# Patient Record
Sex: Male | Born: 1975 | Race: Asian | Hispanic: No | Marital: Single | Smoking: Never smoker
Health system: Southern US, Community
[De-identification: ages and names within clinical notes are randomized; demographics above are authoritative.]

## PROBLEM LIST (undated history)

## (undated) DIAGNOSIS — J323 Chronic sphenoidal sinusitis: Secondary | ICD-10-CM

## (undated) HISTORY — PX: ANKLE SURGERY: SHX546

---

## 2013-08-30 ENCOUNTER — Emergency Department: Payer: Self-pay | Admitting: Emergency Medicine

## 2013-12-04 ENCOUNTER — Emergency Department: Payer: Self-pay | Admitting: Emergency Medicine

## 2014-03-09 ENCOUNTER — Emergency Department: Payer: Self-pay | Admitting: Internal Medicine

## 2014-10-21 IMAGING — CR DG CHEST 2V
1 series · 2 of 2 positions shown · non-contrast
Comparison: None.

CLINICAL DATA: Cough

EXAM:
CHEST  2 VIEW

[Series 1: w chest pa · 0.14mm/px · 2 of 2 slices shown]
[im 1/2]
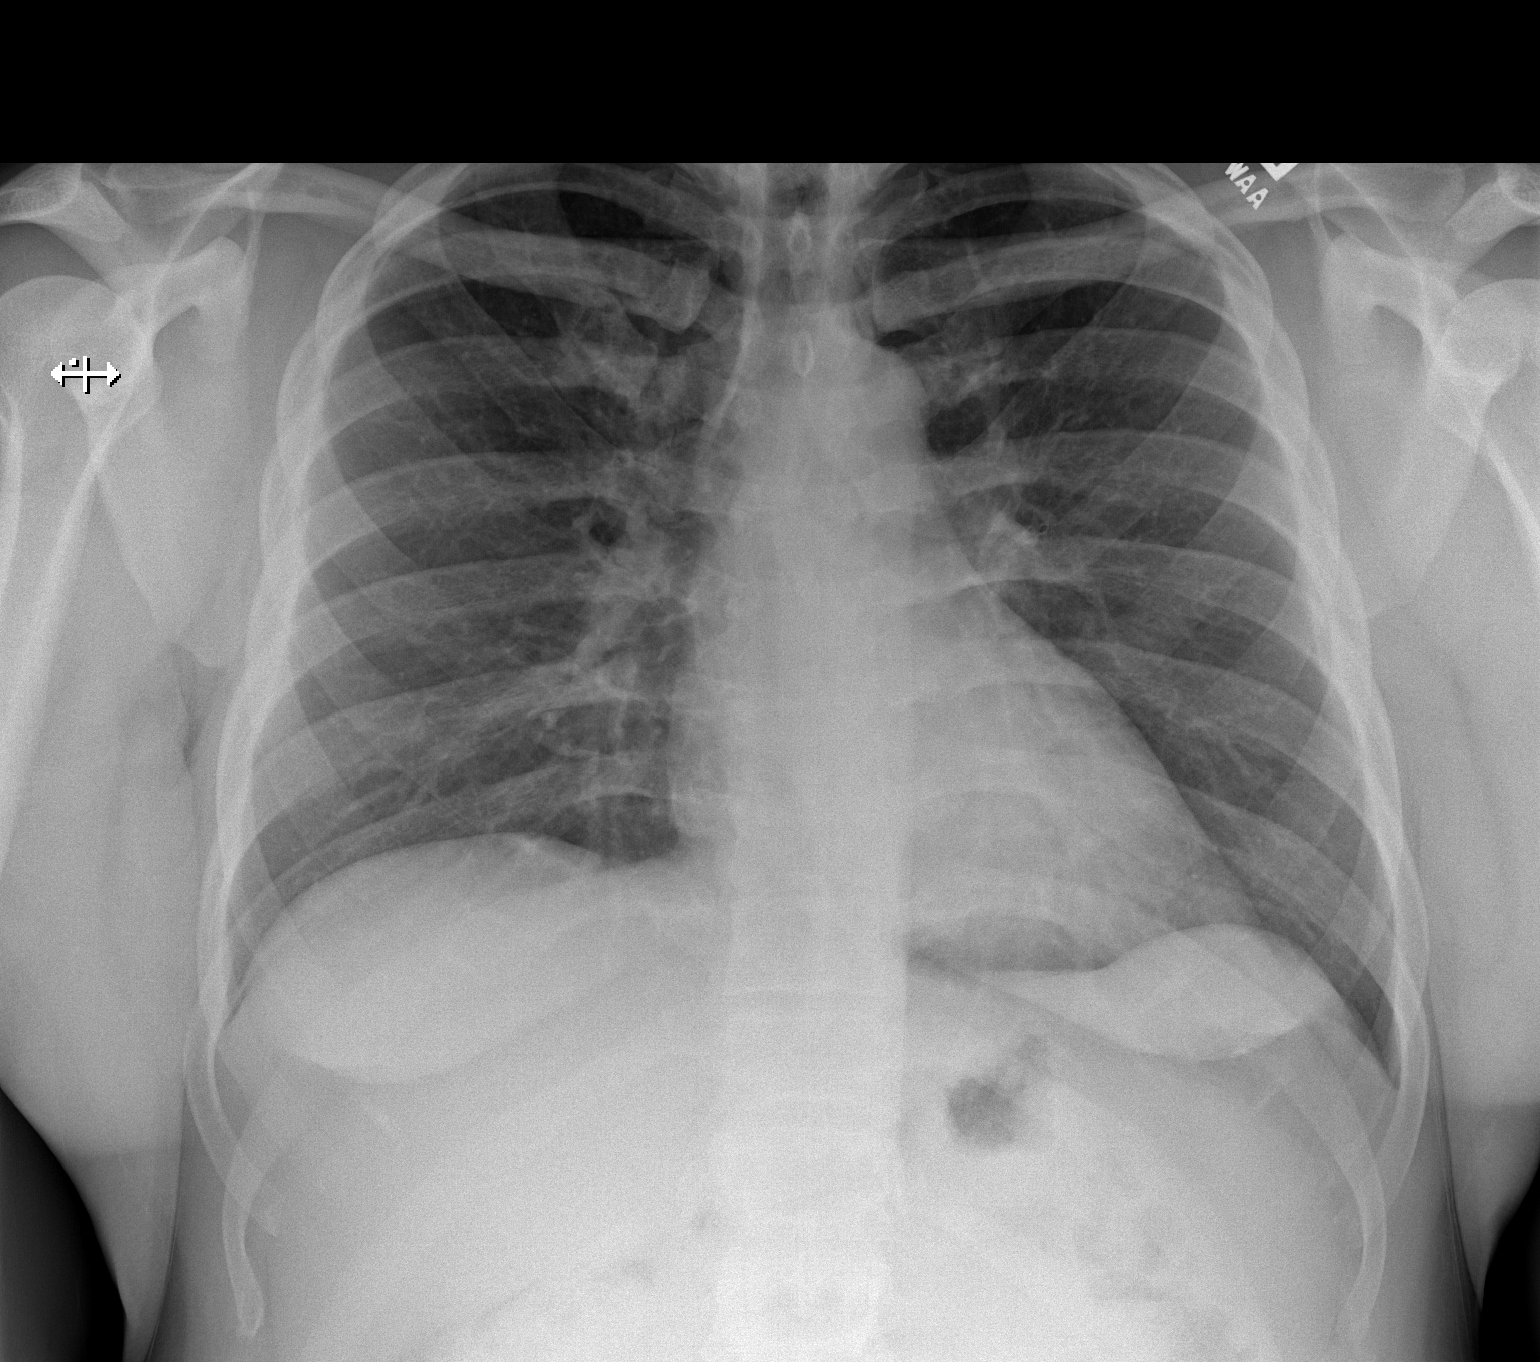
[im 2/2]
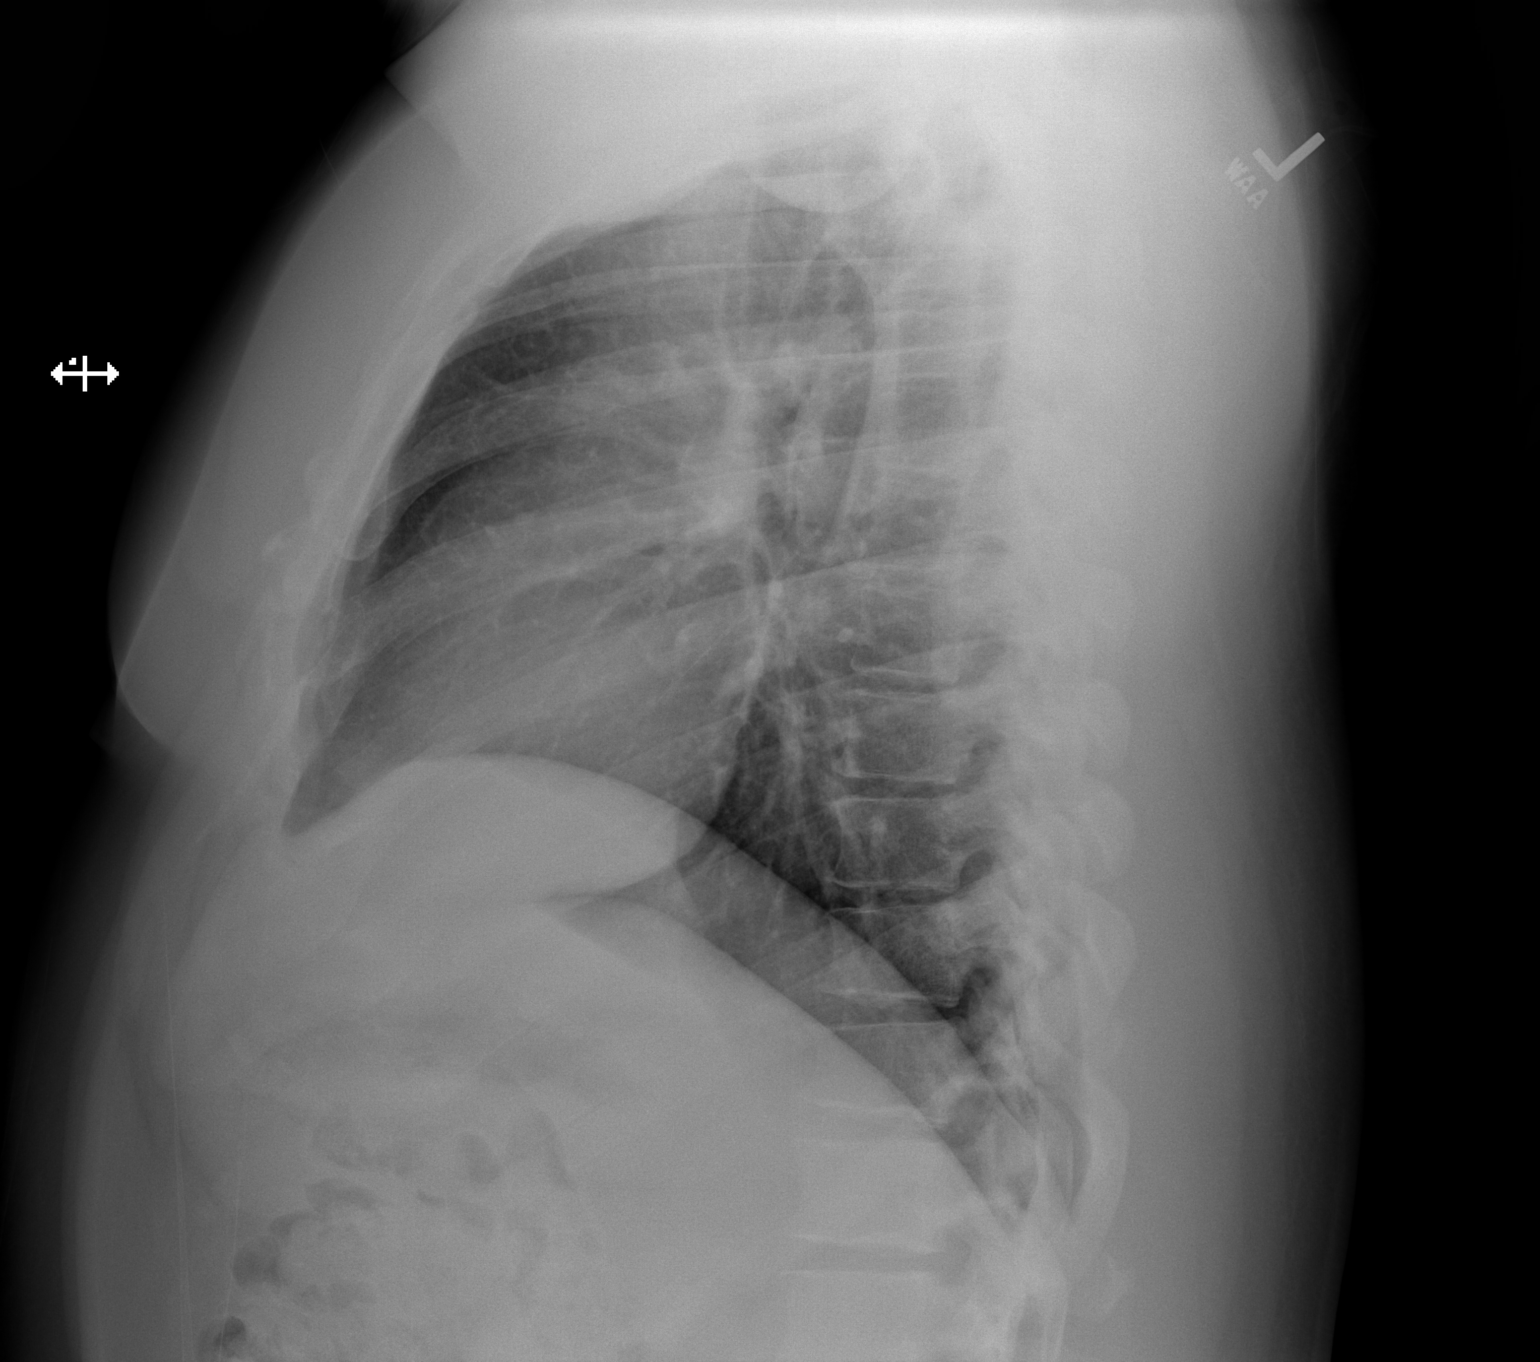

[2 of 2 positions shown; findings below may reference images not displayed]

FINDINGS: Cardiomediastinal silhouette is unremarkable. No acute infiltrate or
pleural effusion. No pulmonary edema. Bony thorax is unremarkable.
IMPRESSION: No active cardiopulmonary disease.

## 2017-07-12 ENCOUNTER — Ambulatory Visit
Admission: EM | Admit: 2017-07-12 | Discharge: 2017-07-12 | Disposition: A | Discharge status: Home / Self Care | Payer: 59 | Attending: Family Medicine | Admitting: Family Medicine

## 2017-07-12 ENCOUNTER — Encounter: Payer: Self-pay | Admitting: *Deleted

## 2017-07-12 DIAGNOSIS — S0591XA Unspecified injury of right eye and orbit, initial encounter: Secondary | ICD-10-CM | POA: Diagnosis not present

## 2017-07-12 DIAGNOSIS — H1131 Conjunctival hemorrhage, right eye: Secondary | ICD-10-CM

## 2017-07-12 HISTORY — DX: Chronic sphenoidal sinusitis: J32.3

## 2017-07-12 MED ORDER — MOXIFLOXACIN HCL 0.5 % OP SOLN: 1.0000 [drp] | Freq: Three times a day (TID) | OPHTHALMIC | 0 refills | Status: AC

## 2017-07-12 NOTE — ED Provider Notes (Signed)
CSN: [id number]     Arrival date & time 07/12/17  1930 History   First MD Initiated Contact with Patient 07/12/17 1956     Chief Complaint  Patient presents with  . Eye Injury   (Consider location/radiation/quality/duration/timing/severity/associated sxs/prior Treatment) HPI  This a 41 year old at work today he injured his right eye when the edge of a cardboard box top flap scratched his eye. Following that incident he noticed some bloodshot area lateral to his iris. He does not feel as though there is anything I in his eye does not have any photophobia. He does wear contacts but not routinely was not wearing them today. His visual acuity today on both eyes was 20/200 without a contacts.        Past Medical History:  Diagnosis Date  . Sphenoidal sinusitis    Past Surgical History:  Procedure Laterality Date  . ANKLE SURGERY     Family History  Problem Relation Age of Onset  . Cancer Mother   . Hypertension Mother   . Hypertension Father    Social History  Substance Use Topics  . Smoking status: Never Smoker  . Smokeless tobacco: Never Used  . Alcohol use Yes    Review of Systems  Constitutional: Positive for activity change. Negative for appetite change, chills, fatigue and fever.  Eyes: Positive for redness. Negative for photophobia, pain, discharge, itching and visual disturbance.    Allergies  Patient has no known allergies.  Home Medications   Prior to Admission medications   Medication Sig Start Date End Date Taking? Authorizing Provider  moxifloxacin (VIGAMOX) 0.5 % ophthalmic solution Place 1 drop into the right eye 3 (three) times daily. 07/12/17   Lutricia Feil, PA-C   Meds Ordered and Administered this Visit  Medications - No data to display  BP (!) 129/95 (BP Location: Left Arm)   Pulse 62   Temp 98.5 F (36.9 C) (Oral)   Resp 16   Ht 5\' 9"  (1.753 m)   Wt 231 lb (104.8 kg)   SpO2 100%   BMI 34.11 kg/m  No data found.   Physical Exam   Constitutional: He is oriented to person, place, and time. He appears well-developed and well-nourished. No distress.  HENT:  Head: Normocephalic.  Eyes: Pupils are equal, round, and reactive to light. EOM are normal. Right eye exhibits no discharge. Left eye exhibits no discharge.  Examination of the right eye shows a subconjunctival hemorrhage laterally. It barely reaches the iris. EOMs are intact. PERRLA. There is no free foreign objects under the upper lid which was everted. The eye was anesthetized with tetracaine. With adequate anesthesia florescence stain was then utilized with a Wood's lamp to examine the eye. The cornea was not abraded. He did have fluoresceine uptake in the conjunctiva lateral to the iris. No hyphema was seen. Visual acuity was 20/200 on both eyes.  Neck: Normal range of motion.  Musculoskeletal: Normal range of motion.  Neurological: He is alert and oriented to person, place, and time.  Skin: Skin is warm and dry. He is not diaphoretic.  Psychiatric: He has a normal mood and affect. His behavior is normal. Judgment and thought content normal.  Nursing note and vitals reviewed.   Urgent Care Course     Procedures (including critical care time)  Labs Review Labs Reviewed - No data to display  Imaging Review No results found.   Visual Acuity Review  Right Eye Distance: 20/200 Left Eye Distance: 20/200 Bilateral Distance: 20/200  Right Eye Near:   Left Eye Near:    Bilateral Near:         MDM   1. Subconjunctival bleed, right    Discharge Medication List as of 07/12/2017  8:13 PM    START taking these medications   Details  moxifloxacin (VIGAMOX) 0.5 % ophthalmic solution Place 1 drop into the right eye 3 (three) times daily., Starting Tue 07/12/2017, Normal      Plan: 1. Test/x-ray results and diagnosis reviewed with patient 2. rx as per orders; risks, benefits, potential side effects reviewed with patient 3. Recommend supportive treatment  with use of eyedrops as prescribed. Recommended the patient be seen by ophthalmology tomorrow. He will need vision correction and follow-up of the scratch of the conjunctiva..  4. F/u prn if symptoms worsen or don't improve     Lutricia FeilRoemer, Lunette Tapp P, PA-C 07/12/17 2029

## 2017-07-12 NOTE — ED Triage Notes (Signed)
Patient injured his right eye today when it was hit with the edge of a cardboard box top flap.

## 2017-07-12 NOTE — ED Notes (Signed)
Patient normally wears contact lenses, but have removed them due to right eye injury.

## 2018-04-20 ENCOUNTER — Ambulatory Visit
Admission: EM | Admit: 2018-04-20 | Discharge: 2018-04-20 | Disposition: A | Discharge status: Home / Self Care | Payer: 59 | Attending: Family Medicine | Admitting: Family Medicine

## 2018-04-20 ENCOUNTER — Other Ambulatory Visit: Payer: Self-pay

## 2018-04-20 DIAGNOSIS — Z23 Encounter for immunization: Secondary | ICD-10-CM

## 2018-04-20 DIAGNOSIS — A630 Anogenital (venereal) warts: Secondary | ICD-10-CM | POA: Diagnosis not present

## 2018-04-20 DIAGNOSIS — S61213A Laceration without foreign body of left middle finger without damage to nail, initial encounter: Secondary | ICD-10-CM | POA: Diagnosis not present

## 2018-04-20 MED ORDER — MUPIROCIN 2 % EX OINT: 1.0000 "application " | TOPICAL_OINTMENT | Freq: Two times a day (BID) | CUTANEOUS | 0 refills | Status: AC

## 2018-04-20 MED ORDER — IMIQUIMOD 5 % EX CREA: TOPICAL_CREAM | CUTANEOUS | 1 refills | Status: AC

## 2018-04-20 MED ORDER — TETANUS-DIPHTH-ACELL PERTUSSIS 5-2.5-18.5 LF-MCG/0.5 IM SUSP
0.5000 mL | Freq: Once | INTRAMUSCULAR | Status: AC
  Administered 2018-04-20: 0.5 mL via INTRAMUSCULAR

## 2018-04-20 NOTE — Discharge Instructions (Signed)
Medication as prescribed.  Take care  Dr. Hedaya Latendresse  

## 2018-04-20 NOTE — ED Provider Notes (Signed)
[facility]    CSN: [id number] Arrival date & time: 04/20/18  1428  History   Chief Complaint Chief Complaint  Patient presents with  . Laceration   HPI  42 year old male presents with a laceration.  Patient states that he cut his left middle finger on a saw yesterday afternoon.  He cut the tip of his finger.  The skin was was avulsed off.  He states that he is unsure of his last tetanus.  He presents primarily to obtain a tetanus shot.  Wound appears to be clean.  No bleeding currently.  Additionally, patient states that he has a history of genital warts.  He would like a topical agent to be prescribed.  He is used "acid" in the past with good results.  He is requesting medication today.  Past Medical History:  Diagnosis Date  . Sphenoidal sinusitis    Past Surgical History:  Procedure Laterality Date  . ANKLE SURGERY     Home Medications    Prior to Admission medications   Medication Sig Start Date End Date Taking? Authorizing Provider  imiquimod (ALDARA) 5 % cream Apply topically 3 (three) times a week. For up to 16 weeks. 04/21/18   Tommie Sams, DO  moxifloxacin (VIGAMOX) 0.5 % ophthalmic solution Place 1 drop into the right eye 3 (three) times daily. 07/12/17   Lutricia Feil, PA-C  mupirocin ointment (BACTROBAN) 2 % Apply 1 application topically 2 (two) times daily for 7 days. 04/20/18 04/27/18  Tommie Sams, DO   Family History Family History  Problem Relation Age of Onset  . Cancer Mother   . Hypertension Mother   . Hypertension Father    Social History Social History   Tobacco Use  . Smoking status: Never Smoker  . Smokeless tobacco: Never Used  Substance Use Topics  . Alcohol use: Yes    Comment: occasionally  . Drug use: No   Allergies   Patient has no known allergies.   Review of Systems Review of Systems  Genitourinary:       Genital warts.  Skin: Positive for wound.   Physical Exam Triage Vital Signs ED Triage Vitals  Enc  Vitals Group     BP 04/20/18 1448 (!) 157/84     Pulse Rate 04/20/18 1448 81     Resp 04/20/18 1448 18     Temp 04/20/18 1448 98.5 F (36.9 C)     Temp Source 04/20/18 1448 Oral     SpO2 04/20/18 1448 99 %     Weight 04/20/18 1447 224 lb (101.6 kg)     Height 04/20/18 1447 5' 9.5" (1.765 m)     Head Circumference --      Peak Flow --      Pain Score 04/20/18 1446 1     Pain Loc --      Pain Edu? --      Excl. in GC? --    Updated Vital Signs BP (!) 157/84 (BP Location: Left Arm)   Pulse 81   Temp 98.5 F (36.9 C) (Oral)   Resp 18   Ht 5' 9.5" (1.765 m)   Wt 224 lb (101.6 kg)   SpO2 99%   BMI 32.60 kg/m   Visual Acuity Right Eye Distance:   Left Eye Distance:   Bilateral Distance:    Right Eye Near:   Left Eye Near:    Bilateral Near:     Physical Exam  Constitutional: He is oriented to  person, place, and time. He appears well-developed. No distress.  HENT:  Head: Normocephalic and atraumatic.  Pulmonary/Chest: Effort normal. No respiratory distress.  Neurological: He is alert and oriented to person, place, and time.  Skin:  Tip of the left middle finger with portion of skin removed.  Wound is not amenable to repair.  Very superficial.  Psychiatric: He has a normal mood and affect. His behavior is normal.  Nursing note and vitals reviewed.  UC Treatments / Results  Labs (all labs ordered are listed, but only abnormal results are displayed) Labs Reviewed - No data to display  EKG None  Radiology No results found.  Procedures Procedures (including critical care time)  Medications Ordered in UC Medications  Tdap (BOOSTRIX) injection 0.5 mL (0.5 mLs Intramuscular Given 04/20/18 1603)    Initial Impression / Assessment and Plan / UC Course  I have reviewed the triage vital signs and the nursing notes.  Pertinent labs & imaging results that were available during my care of the patient were reviewed by me and considered in my medical decision making (see  chart for details).    42 year old male presents with laceration.  This wound is superficial and is not amenable to repair.  Treating with Bactroban ointment.  Advised to keep covered and clean.  Patient requesting treatment for genital warts.  Rx for imiquimod given.  Final Clinical Impressions(s) / UC Diagnoses   Final diagnoses:  Laceration of left middle finger without foreign body without damage to nail, initial encounter  Genital warts    ED Prescriptions    Medication Sig Dispense Auth. Provider   mupirocin ointment (BACTROBAN) 2 % Apply 1 application topically 2 (two) times daily for 7 days. 22 g Khaza Blansett G, DO   imiquimod (ALDARA) 5 % cream Apply topically 3 (three) times a week. For up to 16 weeks. 24 each Tommie Sams, DO     Controlled Substance Prescriptions [place] Controlled Substance Registry consulted? Not Applicable   Tommie Sams, DO 04/20/18 1703

## 2018-04-20 NOTE — ED Triage Notes (Signed)
Patient states that he cut his middle left finger on a saw yesterday around 530pm. Patient states that he is unsure of last tetanus shot.

## 2023-05-11 ENCOUNTER — Ambulatory Visit
Admission: EM | Admit: 2023-05-11 | Discharge: 2023-05-11 | Disposition: A | Discharge status: Home / Self Care | Payer: 59 | Attending: Emergency Medicine | Admitting: Emergency Medicine

## 2023-05-11 DIAGNOSIS — J069 Acute upper respiratory infection, unspecified: Secondary | ICD-10-CM

## 2023-05-11 DIAGNOSIS — R030 Elevated blood-pressure reading, without diagnosis of hypertension: Secondary | ICD-10-CM | POA: Diagnosis not present

## 2023-05-11 LAB — POCT RAPID STREP A (OFFICE): Rapid Strep A Screen: NEGATIVE

## 2023-05-11 NOTE — Discharge Instructions (Addendum)
Your blood pressure is elevated today at 177/106; repeat 125/91.    Please establish a primary care provider to follow up on your elevated blood pressure.

## 2023-05-11 NOTE — ED Provider Notes (Signed)
[facility]    CSN: [id number] Arrival date & time: 05/11/23  1227      History   Chief Complaint Chief Complaint  Patient presents with   Sore Throat    HPI Brent Casey is a 47 y.o. male.  Patient presents with 5-day history of sore throat, postnasal drip, congestion, cough.  His symptoms are improving other than the sore throat and congestion.  Treating with Mucinex and Tylenol.  No fever, chest pain, shortness of breath, vomiting, diarrhea, or other symptoms.  He denies pertinent medical history.  He does not have a PCP.  The history is provided by the patient and medical records.    Past Medical History:  Diagnosis Date   Sphenoidal sinusitis     There are no problems to display for this patient.   Past Surgical History:  Procedure Laterality Date   ANKLE SURGERY         Home Medications    Prior to Admission medications   Medication Sig Start Date End Date Taking? Authorizing Provider  imiquimod (ALDARA) 5 % cream Apply topically 3 (three) times a week. For up to 16 weeks. 04/21/18   Tommie Sams, DO  moxifloxacin (VIGAMOX) 0.5 % ophthalmic solution Place 1 drop into the right eye 3 (three) times daily. 07/12/17   Lutricia Feil, PA-C    Family History Family History  Problem Relation Age of Onset   Cancer Mother    Hypertension Mother    Hypertension Father     Social History Social History   Tobacco Use   Smoking status: Never   Smokeless tobacco: Never  Vaping Use   Vaping Use: Never used  Substance Use Topics   Alcohol use: Yes    Comment: occasionally   Drug use: No     Allergies   Patient has no known allergies.   Review of Systems Review of Systems  Constitutional:  Negative for chills and fever.  HENT:  Positive for congestion, postnasal drip and sore throat.   Respiratory:  Positive for cough. Negative for shortness of breath.   Cardiovascular:  Negative for chest pain and palpitations.  Gastrointestinal:   Negative for diarrhea and vomiting.     Physical Exam Triage Vital Signs ED Triage Vitals  Enc Vitals Group     BP 05/11/23 1241 (!) 177/106     Pulse Rate 05/11/23 1241 92     Resp --      Temp 05/11/23 1241 98.7 F (37.1 C)     Temp Source 05/11/23 1241 Oral     SpO2 05/11/23 1241 97 %     Weight --      Height --      Head Circumference --      Peak Flow --      Pain Score 05/11/23 1242 8     Pain Loc --      Pain Edu? --      Excl. in GC? --    No data found.  Updated Vital Signs BP (!) 125/91 (BP Location: Right Arm)   Pulse 92   Temp 98.7 F (37.1 C) (Oral)   SpO2 97%   Visual Acuity Right Eye Distance:   Left Eye Distance:   Bilateral Distance:    Right Eye Near:   Left Eye Near:    Bilateral Near:     Physical Exam Vitals and nursing note reviewed.  Constitutional:      General: He is not in acute  distress.    Appearance: Normal appearance. He is well-developed. He is not ill-appearing.  HENT:     Right Ear: Tympanic membrane normal.     Left Ear: Tympanic membrane normal.     Nose: Nose normal.     Mouth/Throat:     Mouth: Mucous membranes are moist.     Pharynx: Oropharynx is clear.     Comments: Clear PND. Cardiovascular:     Rate and Rhythm: Normal rate and regular rhythm.     Heart sounds: Normal heart sounds.  Pulmonary:     Effort: Pulmonary effort is normal. No respiratory distress.     Breath sounds: Normal breath sounds.  Musculoskeletal:     Cervical back: Neck supple.  Skin:    General: Skin is warm and dry.  Neurological:     Mental Status: He is alert.  Psychiatric:        Mood and Affect: Mood normal.        Behavior: Behavior normal.      UC Treatments / Results  Labs (all labs ordered are listed, but only abnormal results are displayed) Labs Reviewed  POCT RAPID STREP A (OFFICE)    EKG   Radiology No results found.  Procedures Procedures (including critical care time)  Medications Ordered in  UC Medications - No data to display  Initial Impression / Assessment and Plan / UC Course  I have reviewed the triage vital signs and the nursing notes.  Pertinent labs & imaging results that were available during my care of the patient were reviewed by me and considered in my medical decision making (see chart for details).    Elevated blood pressure reading, Viral URI.  Patient does not currently have a PCP.  He declines assistance with establishing today but he will go onto the, website and schedule an appointment with a PCP.  Discussed that his blood pressure is elevated.  Education provided on preventing hypertension.  Also discussed symptomatic treatment for his URI symptoms.  Rapid strep negative.  He agrees to plan of care.  Final Clinical Impressions(s) / UC Diagnoses   Final diagnoses:  Elevated blood pressure reading  Viral URI     Discharge Instructions      Your blood pressure is elevated today at 177/106; repeat 125/91.    Please establish a primary care provider to follow up on your elevated blood pressure.            ED Prescriptions   None    PDMP not reviewed this encounter.   Mickie Bail, NP 05/11/23 1308

## 2023-05-11 NOTE — ED Triage Notes (Signed)
Pt states he is having cough, congestion, sore throat, with pain on back of tongue and slight swelling and white covering. That started a week ago. Taking mucinx, chloroptic, tylenol.
